# Patient Record
Sex: Male | Born: 2000
Health system: Southern US, Community
[De-identification: ages and names within clinical notes are randomized; demographics above are authoritative.]

## PROBLEM LIST (undated history)

## (undated) DIAGNOSIS — K2970 Gastritis, unspecified, without bleeding: Secondary | ICD-10-CM

## (undated) DIAGNOSIS — K209 Esophagitis, unspecified without bleeding: Secondary | ICD-10-CM

## (undated) DIAGNOSIS — T7840XA Allergy, unspecified, initial encounter: Secondary | ICD-10-CM

## (undated) DIAGNOSIS — K929 Disease of digestive system, unspecified: Secondary | ICD-10-CM

## (undated) DIAGNOSIS — J189 Pneumonia, unspecified organism: Secondary | ICD-10-CM

## (undated) HISTORY — DX: Pneumonia, unspecified organism: J18.9

## (undated) HISTORY — DX: Esophagitis, unspecified: K20.9

## (undated) HISTORY — DX: Allergy, unspecified, initial encounter: T78.40XA

## (undated) HISTORY — DX: Esophagitis, unspecified without bleeding: K20.90

## (undated) HISTORY — DX: Disease of digestive system, unspecified: K92.9

## (undated) HISTORY — PX: ADENOIDECTOMY: SUR15

## (undated) HISTORY — DX: Gastritis, unspecified, without bleeding: K29.70

## (undated) HISTORY — PX: CIRCUMCISION: SUR203

---
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
## 2018-01-31 ENCOUNTER — Encounter (HOSPITAL_COMMUNITY): Payer: Self-pay | Admitting: Psychiatry

## 2018-01-31 ENCOUNTER — Ambulatory Visit (INDEPENDENT_AMBULATORY_CARE_PROVIDER_SITE_OTHER): Payer: No Typology Code available for payment source | Admitting: Psychiatry

## 2018-01-31 ENCOUNTER — Other Ambulatory Visit: Payer: Self-pay

## 2018-01-31 VITALS — BP 118/80 | HR 98 | Ht 72.05 in | Wt 179.0 lb

## 2018-01-31 DIAGNOSIS — F1290 Cannabis use, unspecified, uncomplicated: Secondary | ICD-10-CM

## 2018-01-31 DIAGNOSIS — F332 Major depressive disorder, recurrent severe without psychotic features: Secondary | ICD-10-CM | POA: Diagnosis not present

## 2018-01-31 DIAGNOSIS — F900 Attention-deficit hyperactivity disorder, predominantly inattentive type: Secondary | ICD-10-CM | POA: Diagnosis not present

## 2018-01-31 MED ORDER — ARIPIPRAZOLE 15 MG PO TABS: 15.0000 mg | ORAL_TABLET | Freq: Every day | ORAL | 0 refills | Status: DC

## 2018-01-31 MED ORDER — SERTRALINE HCL 100 MG PO TABS: 100.0000 mg | ORAL_TABLET | Freq: Every day | ORAL | 0 refills | Status: DC
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
## 2018-03-07 ENCOUNTER — Encounter (HOSPITAL_COMMUNITY): Payer: Self-pay | Admitting: Psychiatry

## 2018-03-07 ENCOUNTER — Ambulatory Visit (INDEPENDENT_AMBULATORY_CARE_PROVIDER_SITE_OTHER): Payer: No Typology Code available for payment source | Admitting: Psychiatry

## 2018-03-07 ENCOUNTER — Other Ambulatory Visit: Payer: Self-pay

## 2018-03-07 VITALS — BP 122/80 | HR 97 | Ht 72.5 in | Wt 194.0 lb

## 2018-03-07 DIAGNOSIS — F332 Major depressive disorder, recurrent severe without psychotic features: Secondary | ICD-10-CM | POA: Diagnosis not present

## 2018-03-07 DIAGNOSIS — F900 Attention-deficit hyperactivity disorder, predominantly inattentive type: Secondary | ICD-10-CM | POA: Diagnosis not present

## 2018-03-07 DIAGNOSIS — F1290 Cannabis use, unspecified, uncomplicated: Secondary | ICD-10-CM | POA: Diagnosis not present

## 2018-03-07 MED ORDER — SERTRALINE HCL 100 MG PO TABS: 100.0000 mg | ORAL_TABLET | Freq: Every day | ORAL | 1 refills | Status: DC

## 2018-03-07 MED ORDER — ARIPIPRAZOLE 20 MG PO TABS: 20.0000 mg | ORAL_TABLET | Freq: Every day | ORAL | 1 refills | Status: DC

## 2018-03-07 NOTE — Progress Notes (Signed)
[facility] Outpatient Follow up visit   Patient Identification: Dillon Livermore MRN:  [id number] Date of Evaluation:  03/07/2018 Referral Source: Primary care Chief Complaint:   Chief Complaint    Follow-up; Other     Visit Diagnosis:    ICD-10-CM   1. Major depressive disorder, recurrent severe without psychotic features (HCC) F33.2   2. Attention deficit hyperactivity disorder (ADHD), predominantly inattentive type F90.0   3. Marijuana smoker F12.90     History of Present Illness:  17 years old single white male. Lives with grandparents Suffers from depression admitted in April with suicidal ideations and breakup leading to worsening of depressive and apathy. Also uses marijuana fairly regularly at first visit  Last visit abilify was increased . That helped some but notices there are days when he feels blah till he goes to sleep Still using marijuana but says once a week or less.  He understands the risk of use but states when he is with friends  no tremors  stays at homes . Likes to do drum beats  On adderall for adhd. Despite being at home, says it helps to keep him awake  Modifying F: Grandparents. Drums , music Aggravating F: step dad Duration: more then 5 years      Past Psychiatric History: depression  Previous Psychotropic Medications: Yes   Substance Abuse History in the last 12 months:  Yes.    Consequences of Substance Abuse: Medical Consequences:  depression and impaired judjement  Past Medical History:  Past Medical History:  Diagnosis Date  . Allergy   . Asthma   . Digestive disorder   . Esophagitis   . Gastritis   . Pneumonia     Past Surgical History:  Procedure Laterality Date  . ADENOIDECTOMY    . CIRCUMCISION      Family Psychiatric History: says drug and alcohol use runs in my family Mom also has depression Grandparents ; depression  Family History:  Family History  Problem Relation Age of Onset  . Allergies Mother   . Cancer Maternal  Grandfather   . Allergies Maternal Grandfather   . Heart disease Father   . Cancer Maternal Grandmother   . Cancer Paternal Grandmother     Social History:   Social History   Socioeconomic History  . Marital status: Single    Spouse name: Not on file  . Number of children: Not on file  . Years of education: Not on file  . Highest education level: Not on file  Occupational History  . Not on file  Social Needs  . Financial resource strain: Not on file  . Food insecurity:    Worry: Not on file    Inability: Not on file  . Transportation needs:    Medical: Not on file    Non-medical: Not on file  Tobacco Use  . Smoking status: Never Smoker  . Smokeless tobacco: Never Used  Substance and Sexual Activity  . Alcohol use: No  . Drug use: Yes    Frequency: 3.0 times per week    Types: Marijuana    Comment: a few times a week  . Sexual activity: Yes    Birth control/protection: None    Comment: Identifies as Bisexual  Lifestyle  . Physical activity:    Days per week: Not on file    Minutes per session: Not on file  . Stress: Not on file  Relationships  . Social connections:    Talks on phone: Not on file  Gets together: Not on file    Attends religious service: Not on file    Active member of club or organization: Not on file    Attends meetings of clubs or organizations: Not on file    Relationship status: Not on file  Other Topics Concern  . Not on file  Social History Narrative  . Not on file      Allergies:   Allergies  Allergen Reactions  . Lactase Other (See Comments)    Upset stomach and bowel movements     Metabolic Disorder Labs: Lab Results  Component Value Date   HGBA1C 4.9 11/28/2017   MPG 93.93 11/28/2017   Lab Results  Component Value Date   PROLACTIN 5.2 11/28/2017   Lab Results  Component Value Date   CHOL 112 11/28/2017   TRIG 58 11/28/2017   HDL 46 11/28/2017   CHOLHDL 2.4 11/28/2017   VLDL 12 11/28/2017   LDLCALC 54  11/28/2017     Current Medications: Current Outpatient Medications  Medication Sig Dispense Refill  . albuterol (PROVENTIL HFA;VENTOLIN HFA) 108 (90 BASE) MCG/ACT inhaler Inhale 2 puffs into the lungs every 6 (six) hours as needed. For shortness of breath or wheezing     . amphetamine-dextroamphetamine (ADDERALL XR) 30 MG 24 hr capsule Take 1 capsule (30 mg total) by mouth every morning. 30 capsule 0  . ARIPiprazole (ABILIFY) 20 MG tablet Take 1 tablet (20 mg total) by mouth at bedtime. 30 tablet 1  . Fluticasone-Salmeterol (ADVAIR HFA IN) Inhale 2 puffs into the lungs every 12 (twelve) hours as needed (SOB).     Marland Kitchen sertraline (ZOLOFT) 100 MG tablet Take 1 tablet (100 mg total) by mouth at bedtime. 30 tablet 1   No current facility-administered medications for this visit.       Psychiatric Specialty Exam: Review of Systems  Cardiovascular: Negative for palpitations.  Neurological: Negative for tremors.  Psychiatric/Behavioral: Positive for depression.    Blood pressure 122/80, pulse 97, height 6' 0.5" (1.842 m), weight 194 lb (88 kg).Body mass index is 25.95 kg/m.  General Appearance: Casual  Eye Contact:  Fair  Speech:  Slow  Volume:  Normal  Mood:  subdued  Affect:  Constricted  Thought Process:  Goal Directed  Orientation:  Full (Time, Place, and Person)  Thought Content:  Rumination  Suicidal Thoughts:  No  Homicidal Thoughts:  No  Memory:  Immediate;   Fair Recent;   Fair  Judgement:  Fair  Insight:  Shallow  Psychomotor Activity:  Normal  Concentration:  Concentration: Fair and Attention Span: Fair  Recall:  [employer] of Knowledge:Fair  Language: Fair  Akathisia:  Negative  Handed:  Right  AIMS (if indicated):  0  Assets:  Desire for Improvement  ADL's:  Intact  Cognition: WNL  Sleep:  fair    Treatment Plan Summary: Medication management and Plan as follows  1. Mood disorder, possible as MDD recurrent moderate: endorses sad days. Increase abilifyt to  20mg . Abstain from marijuana   2. ADHD diagnosed by primary care on adderall or stimulants for last 3 years. Says feels sleepy without it, caution as it can make anxiety worse 3. Marijuana smoker: says not using regularly. encoureged to stop so meds would work and discussed its effect on mood, paranoia and judjement  Fu 4 w but call back in 2 weeks. Not suicidal.   Doing therapy says will continue with prior therapist. Working on coping skills and depression     Elaysia Devargas  Gilmore LarocheAkhtar, MD 7/16/20193:44 PM

## 2018-03-08 ENCOUNTER — Other Ambulatory Visit (HOSPITAL_COMMUNITY): Payer: Self-pay | Admitting: Psychiatry

## 2018-03-22 ENCOUNTER — Telehealth (HOSPITAL_COMMUNITY): Payer: Self-pay | Admitting: Psychiatry

## 2018-03-22 NOTE — Telephone Encounter (Signed)
Pt is doing fine on meds. His only concern is if he don't take the adderall in the morning then he becomes very sleepy in the afternoon. He would end up taking about a 4 hour nap.  He would like to know If there is something we can do for his sleepiness.   Please advise

## 2018-03-22 NOTE — Telephone Encounter (Signed)
He has been getting adderall from primary care, he can adjust that time  . In regard to abilify if its making him sleepy should take at evening.

## 2018-03-22 NOTE — Telephone Encounter (Signed)
Patient states that he is already taking the Zoloft and Abilify at night and if he doesn't take his Adderall during the day he will not get anything done. He wants to know what he can do about being so sleepy during the day.

## 2018-03-23 NOTE — Telephone Encounter (Signed)
Informed patient per Dr. Gilmore LarocheAkhtar to take Abilify in the day and try to stay substance free. Patient stated his understanding.

## 2018-03-23 NOTE — Telephone Encounter (Signed)
Change abilify for day. I am not taking over adderall prescription if has been given by primary care. He can call primary care for refills.  Also to remain substance free

## 2018-04-18 ENCOUNTER — Ambulatory Visit (HOSPITAL_COMMUNITY): Payer: No Typology Code available for payment source | Admitting: Psychiatry

## 2018-05-01 ENCOUNTER — Other Ambulatory Visit: Payer: Self-pay

## 2018-05-01 ENCOUNTER — Ambulatory Visit (INDEPENDENT_AMBULATORY_CARE_PROVIDER_SITE_OTHER): Payer: No Typology Code available for payment source | Admitting: Psychiatry

## 2018-05-01 ENCOUNTER — Encounter (HOSPITAL_COMMUNITY): Payer: Self-pay | Admitting: Psychiatry

## 2018-05-01 VITALS — BP 98/68 | HR 82 | Ht 72.5 in | Wt 203.0 lb

## 2018-05-01 DIAGNOSIS — F332 Major depressive disorder, recurrent severe without psychotic features: Secondary | ICD-10-CM | POA: Diagnosis not present

## 2018-05-01 DIAGNOSIS — F1290 Cannabis use, unspecified, uncomplicated: Secondary | ICD-10-CM

## 2018-05-01 DIAGNOSIS — F900 Attention-deficit hyperactivity disorder, predominantly inattentive type: Secondary | ICD-10-CM

## 2018-05-01 DIAGNOSIS — Z818 Family history of other mental and behavioral disorders: Secondary | ICD-10-CM | POA: Diagnosis not present

## 2018-05-01 MED ORDER — SERTRALINE HCL 100 MG PO TABS: 100.0000 mg | ORAL_TABLET | Freq: Every day | ORAL | 1 refills | Status: DC

## 2018-05-01 MED ORDER — ARIPIPRAZOLE 20 MG PO TABS: 20.0000 mg | ORAL_TABLET | Freq: Every day | ORAL | 1 refills | Status: DC

## 2018-05-01 NOTE — Progress Notes (Signed)
[facility] Outpatient Follow up visit   Patient Identification: Paul Warren MRN:  [id number] Date of Evaluation:  05/01/2018 Referral Source: Primary care Chief Complaint:   Chief Complaint    Follow-up; Other     Visit Diagnosis:    ICD-10-CM   1. Major depressive disorder, recurrent severe without psychotic features (HCC) F33.2   2. Attention deficit hyperactivity disorder (ADHD), predominantly inattentive type F90.0   3. Marijuana smoker F12.90     History of Present Illness:  17 years old single white male. Lives with grandparents Suffers from depression admitted in April with suicidal ideations and breakup leading to worsening of depressive and apathy. Also uses marijuana fairly regularly at first visit Prior admission for suicidal toughts after breakup   abilify on now 20mg . Not depressed on a daily basis. Joined college. Handling it fair.  No tremors  on adderall for adhd. Doesn't take everyday, primary care gives him   Likes to do drum beats   Modifying F: Grandparents. Drums , music Aggravating F: step dad Duration: more then 5 years      Past Psychiatric History: depression  Previous Psychotropic Medications: Yes   Substance Abuse History in the last 12 months:  Yes.    Consequences of Substance Abuse: Medical Consequences:  depression and impaired judjement  Past Medical History:  Past Medical History:  Diagnosis Date  . Allergy   . Asthma   . Digestive disorder   . Esophagitis   . Gastritis   . Pneumonia     Past Surgical History:  Procedure Laterality Date  . ADENOIDECTOMY    . CIRCUMCISION      Family Psychiatric History: says drug and alcohol use runs in my family Mom also has depression Grandparents ; depression  Family History:  Family History  Problem Relation Age of Onset  . Allergies Mother   . Cancer Maternal Grandfather   . Allergies Maternal Grandfather   . Heart disease Father   . Cancer Maternal Grandmother   . Cancer  Paternal Grandmother     Social History:   Social History   Socioeconomic History  . Marital status: Single    Spouse name: Not on file  . Number of children: Not on file  . Years of education: Not on file  . Highest education level: Not on file  Occupational History  . Not on file  Social Needs  . Financial resource strain: Not on file  . Food insecurity:    Worry: Not on file    Inability: Not on file  . Transportation needs:    Medical: Not on file    Non-medical: Not on file  Tobacco Use  . Smoking status: Never Smoker  . Smokeless tobacco: Never Used  Substance and Sexual Activity  . Alcohol use: No  . Drug use: Yes    Frequency: 3.0 times per week    Types: Marijuana    Comment: a few times a week  . Sexual activity: Yes    Birth control/protection: None    Comment: Identifies as Bisexual  Lifestyle  . Physical activity:    Days per week: Not on file    Minutes per session: Not on file  . Stress: Not on file  Relationships  . Social connections:    Talks on phone: Not on file    Gets together: Not on file    Attends religious service: Not on file    Active member of club or organization: Not on file  Attends meetings of clubs or organizations: Not on file    Relationship status: Not on file  Other Topics Concern  . Not on file  Social History Narrative  . Not on file      Allergies:   Allergies  Allergen Reactions  . Lactase Other (See Comments)    Upset stomach and bowel movements     Metabolic Disorder Labs: Lab Results  Component Value Date   HGBA1C 4.9 11/28/2017   MPG 93.93 11/28/2017   Lab Results  Component Value Date   PROLACTIN 5.2 11/28/2017   Lab Results  Component Value Date   CHOL 112 11/28/2017   TRIG 58 11/28/2017   HDL 46 11/28/2017   CHOLHDL 2.4 11/28/2017   VLDL 12 11/28/2017   LDLCALC 54 11/28/2017     Current Medications: Current Outpatient Medications  Medication Sig Dispense Refill  . albuterol  (PROVENTIL HFA;VENTOLIN HFA) 108 (90 BASE) MCG/ACT inhaler Inhale 2 puffs into the lungs every 6 (six) hours as needed. For shortness of breath or wheezing     . amphetamine-dextroamphetamine (ADDERALL XR) 30 MG 24 hr capsule Take 1 capsule (30 mg total) by mouth every morning. 30 capsule 0  . ARIPiprazole (ABILIFY) 20 MG tablet Take 1 tablet (20 mg total) by mouth at bedtime. 30 tablet 1  . Fluticasone-Salmeterol (ADVAIR HFA IN) Inhale 2 puffs into the lungs every 12 (twelve) hours as needed (SOB).     Marland Kitchen sertraline (ZOLOFT) 100 MG tablet Take 1 tablet (100 mg total) by mouth at bedtime. 30 tablet 1   No current facility-administered medications for this visit.       Psychiatric Specialty Exam: Review of Systems  Cardiovascular: Negative for chest pain.  Neurological: Negative for tremors.    Blood pressure 98/68, pulse 82, height 6' 0.5" (1.842 m), weight 203 lb (92.1 kg).Body mass index is 27.15 kg/m.  General Appearance: Casual  Eye Contact:  Fair  Speech:  Slow  Volume:  Normal  Mood:  fair  Affect:  Constricted  Thought Process:  Goal Directed  Orientation:  Full (Time, Place, and Person)  Thought Content:  Rumination  Suicidal Thoughts:  No  Homicidal Thoughts:  No  Memory:  Immediate;   Fair Recent;   Fair  Judgement:  Fair  Insight:  Shallow  Psychomotor Activity:  Normal  Concentration:  Concentration: Fair and Attention Span: Fair  Recall:  [employer] of Knowledge:Fair  Language: Fair  Akathisia:  Negative  Handed:  Right  AIMS (if indicated):  0  Assets:  Desire for Improvement  ADL's:  Intact  Cognition: WNL  Sleep:  fair    Treatment Plan Summary: Medication management and Plan as follows  1. Mood disorder, possible as MDD recurrent moderate: doing fair. Continue abilify  Says when he can start getting off meds. Probably spring next year we can talk about cutting some down   2. ADHD diagnosed by primary care on adderall or stimulants for last 3  years.sleepy without it, gets from primary care 3. Marijuana smoker: hasnt used for last one month. Relapse prevention discussed Fu 2-3 m     Thresa Ross, MD 9/9/20193:09 PM

## 2018-07-04 ENCOUNTER — Other Ambulatory Visit (HOSPITAL_COMMUNITY): Payer: Self-pay | Admitting: Psychiatry

## 2018-07-14 ENCOUNTER — Encounter (HOSPITAL_COMMUNITY): Payer: Self-pay | Admitting: Psychiatry

## 2018-07-14 ENCOUNTER — Ambulatory Visit (INDEPENDENT_AMBULATORY_CARE_PROVIDER_SITE_OTHER): Payer: Medicaid Other | Admitting: Psychiatry

## 2018-07-14 DIAGNOSIS — F1290 Cannabis use, unspecified, uncomplicated: Secondary | ICD-10-CM | POA: Diagnosis not present

## 2018-07-14 DIAGNOSIS — F332 Major depressive disorder, recurrent severe without psychotic features: Secondary | ICD-10-CM | POA: Diagnosis not present

## 2018-07-14 DIAGNOSIS — F900 Attention-deficit hyperactivity disorder, predominantly inattentive type: Secondary | ICD-10-CM | POA: Diagnosis not present

## 2018-07-14 MED ORDER — ARIPIPRAZOLE 20 MG PO TABS: ORAL_TABLET | ORAL | 1 refills | Status: AC

## 2018-07-14 MED ORDER — SERTRALINE HCL 100 MG PO TABS: ORAL_TABLET | ORAL | 1 refills | Status: AC

## 2018-07-14 NOTE — Progress Notes (Signed)
[facility] Outpatient Follow up visit   Patient Identification: Paul Warren MRN:  [id number] Date of Evaluation:  07/14/2018 Referral Source: Primary care Chief Complaint:    Visit Diagnosis:    ICD-10-CM   1. Major depressive disorder, recurrent severe without psychotic features (HCC) F33.2   2. Attention deficit hyperactivity disorder (ADHD), predominantly inattentive type F90.0   3. Marijuana smoker F12.90     History of Present Illness:  17 years old single white male. Lives with grandparents Suffers from depression admitted in April  Prior admission for suicidal toughts after breakup  Doing fair, adjusting tlo [employer] depression not worse. Grades not good so planning to retake some courses next year  Says some concern of pornography or sex addiction. Working in therapy but may be referred to a specialist by that therapist. Says it has been there even before medication use or depression.  on adderall for adhd. Doesn't take everyday, primary care gives him   Likes to do drum beats   Modifying F: grandparents Drums , music Aggravating F: step dad Duration: more then 5 years      Past Psychiatric History: depression  Previous Psychotropic Medications: Yes   Substance Abuse History in the last 12 months:  Yes.    Consequences of Substance Abuse: Medical Consequences:  depression and impaired judjement  Past Medical History:  Past Medical History:  Diagnosis Date  . Allergy   . Asthma   . Digestive disorder   . Esophagitis   . Gastritis   . Pneumonia     Past Surgical History:  Procedure Laterality Date  . ADENOIDECTOMY    . CIRCUMCISION      Family Psychiatric History: says drug and alcohol use runs in my family Mom also has depression Grandparents ; depression  Family History:  Family History  Problem Relation Age of Onset  . Allergies Mother   . Cancer Maternal Grandfather   . Allergies Maternal Grandfather   . Heart disease Father   . Cancer Maternal  Grandmother   . Cancer Paternal Grandmother     Social History:   Social History   Socioeconomic History  . Marital status: Single    Spouse name: Not on file  . Number of children: Not on file  . Years of education: Not on file  . Highest education level: Not on file  Occupational History  . Not on file  Social Needs  . Financial resource strain: Not on file  . Food insecurity:    Worry: Not on file    Inability: Not on file  . Transportation needs:    Medical: Not on file    Non-medical: Not on file  Tobacco Use  . Smoking status: Never Smoker  . Smokeless tobacco: Never Used  Substance and Sexual Activity  . Alcohol use: No  . Drug use: Yes    Frequency: 3.0 times per week    Types: Marijuana    Comment: a few times a week  . Sexual activity: Yes    Birth control/protection: None    Comment: Identifies as Bisexual  Lifestyle  . Physical activity:    Days per week: Not on file    Minutes per session: Not on file  . Stress: Not on file  Relationships  . Social connections:    Talks on phone: Not on file    Gets together: Not on file    Attends religious service: Not on file    Active member of club or organization: Not on  file    Attends meetings of clubs or organizations: Not on file    Relationship status: Not on file  Other Topics Concern  . Not on file  Social History Narrative  . Not on file      Allergies:   Allergies  Allergen Reactions  . Lactase Other (See Comments)    Upset stomach and bowel movements     Metabolic Disorder Labs: Lab Results  Component Value Date   HGBA1C 4.9 11/28/2017   MPG 93.93 11/28/2017   Lab Results  Component Value Date   PROLACTIN 5.2 11/28/2017   Lab Results  Component Value Date   CHOL 112 11/28/2017   TRIG 58 11/28/2017   HDL 46 11/28/2017   CHOLHDL 2.4 11/28/2017   VLDL 12 11/28/2017   LDLCALC 54 11/28/2017     Current Medications: Current Outpatient Medications  Medication Sig Dispense  Refill  . albuterol (PROVENTIL HFA;VENTOLIN HFA) 108 (90 BASE) MCG/ACT inhaler Inhale 2 puffs into the lungs every 6 (six) hours as needed. For shortness of breath or wheezing     . amphetamine-dextroamphetamine (ADDERALL XR) 30 MG 24 hr capsule Take 1 capsule (30 mg total) by mouth every morning. 30 capsule 0  . ARIPiprazole (ABILIFY) 20 MG tablet TAKE 1 TABLET BY MOUTH EVERYDAY AT BEDTIME 30 tablet 1  . Fluticasone-Salmeterol (ADVAIR HFA IN) Inhale 2 puffs into the lungs every 12 (twelve) hours as needed (SOB).     Marland Kitchen. sertraline (ZOLOFT) 100 MG tablet TAKE 1 TABLET BY MOUTH EVERYDAY AT BEDTIME 30 tablet 1   No current facility-administered medications for this visit.       Psychiatric Specialty Exam: Review of Systems  Cardiovascular: Negative for chest pain.  Neurological: Negative for tremors.  Psychiatric/Behavioral: Negative for depression.    There were no vitals taken for this visit.There is no height or weight on file to calculate BMI.  General Appearance: Casual  Eye Contact:  Fair  Speech:  Slow  Volume:  Normal  Mood: fair  Affect:  Constricted  Thought Process:  Goal Directed  Orientation:  Full (Time, Place, and Person)  Thought Content:  Rumination  Suicidal Thoughts:  No  Homicidal Thoughts:  No  Memory:  Immediate;   Fair Recent;   Fair  Judgement:  Fair  Insight:  Shallow  Psychomotor Activity:  Normal  Concentration:  Concentration: Fair and Attention Span: Fair  Recall:  [employer]  Fund of Knowledge:Fair  Language: Fair  Akathisia:  Negative  Handed:  Right  AIMS (if indicated):  0  Assets:  Desire for Improvement  ADL's:  Intact  Cognition: WNL  Sleep:  fair    Treatment Plan Summary: Medication management and Plan as follows  1. Mood disorder, possible as MDD recurrent moderate: doing fair. Continue zoloft, abilify Working in therapy and may get refferal by therapist to a further specialist in addiction 2. ADHD diagnosed by primary care on  adderall 3. Marijuana smoker:says using it infrequently. Discussed to abstain and relapse prevnetion Fu 2-3 m questions addressed     Thresa RossNadeem Charese Abundis, MD 11/22/20191:29 PM

## 2018-09-22 ENCOUNTER — Ambulatory Visit (HOSPITAL_COMMUNITY): Payer: Medicaid Other | Admitting: Psychiatry

## 2018-11-10 ENCOUNTER — Telehealth: Payer: Self-pay | Admitting: General Practice

## 2018-11-10 NOTE — Telephone Encounter (Signed)
Incoming call from Patient who reports  That he woke up this am with chest Pain around his breast bone area like a stabbing sensation.   Patient states the pain is constant.  Patient also states that he does not have a a Primary Care Provider.   Patient was calling out of concern of the Covid-19  Issues also.  Screened for Risk of Coovid -19.  Patient is low risk  Recommend that Patient go to Urgent care for evaluation of chest Pain.  Patient voiced understanding.

## 2018-11-14 DIAGNOSIS — J029 Acute pharyngitis, unspecified: Secondary | ICD-10-CM | POA: Diagnosis not present

## 2018-11-14 DIAGNOSIS — R110 Nausea: Secondary | ICD-10-CM | POA: Diagnosis not present

## 2018-11-14 DIAGNOSIS — K219 Gastro-esophageal reflux disease without esophagitis: Secondary | ICD-10-CM | POA: Diagnosis not present

## 2018-12-05 ENCOUNTER — Other Ambulatory Visit: Payer: Self-pay

## 2018-12-05 ENCOUNTER — Encounter (HOSPITAL_COMMUNITY): Payer: Self-pay | Admitting: *Deleted

## 2018-12-05 ENCOUNTER — Emergency Department (HOSPITAL_COMMUNITY)
Admission: EM | Admit: 2018-12-05 | Discharge: 2018-12-05 | Disposition: A | Discharge status: Home / Self Care | Payer: BLUE CROSS/BLUE SHIELD | Attending: Emergency Medicine | Admitting: Emergency Medicine

## 2018-12-05 DIAGNOSIS — F1994 Other psychoactive substance use, unspecified with psychoactive substance-induced mood disorder: Secondary | ICD-10-CM | POA: Diagnosis not present

## 2018-12-05 DIAGNOSIS — J45909 Unspecified asthma, uncomplicated: Secondary | ICD-10-CM | POA: Insufficient documentation

## 2018-12-05 DIAGNOSIS — F332 Major depressive disorder, recurrent severe without psychotic features: Secondary | ICD-10-CM

## 2018-12-05 DIAGNOSIS — F314 Bipolar disorder, current episode depressed, severe, without psychotic features: Secondary | ICD-10-CM | POA: Diagnosis not present

## 2018-12-05 DIAGNOSIS — R1110 Vomiting, unspecified: Secondary | ICD-10-CM | POA: Diagnosis present

## 2018-12-05 DIAGNOSIS — F1210 Cannabis abuse, uncomplicated: Secondary | ICD-10-CM | POA: Diagnosis not present

## 2018-12-05 DIAGNOSIS — Z79899 Other long term (current) drug therapy: Secondary | ICD-10-CM | POA: Diagnosis not present

## 2018-12-05 DIAGNOSIS — R000 Tachycardia, unspecified: Secondary | ICD-10-CM | POA: Diagnosis not present

## 2018-12-05 DIAGNOSIS — F450 Somatization disorder: Secondary | ICD-10-CM

## 2018-12-05 DIAGNOSIS — F4321 Adjustment disorder with depressed mood: Secondary | ICD-10-CM | POA: Diagnosis not present

## 2018-12-05 DIAGNOSIS — E876 Hypokalemia: Secondary | ICD-10-CM | POA: Diagnosis not present

## 2018-12-05 LAB — COMPREHENSIVE METABOLIC PANEL
ALT: 24 U/L (ref 0–44)
AST: 22 U/L (ref 15–41)
Albumin: 4.5 g/dL (ref 3.5–5.0)
Alkaline Phosphatase: 60 U/L (ref 38–126)
Anion gap: 13 (ref 5–15)
BUN: 10 mg/dL (ref 6–20)
CO2: 23 mmol/L (ref 22–32)
Calcium: 8.7 mg/dL — ABNORMAL LOW (ref 8.9–10.3)
Chloride: 102 mmol/L (ref 98–111)
Creatinine, Ser: 0.94 mg/dL (ref 0.61–1.24)
GFR calc Af Amer: >60 mL/min (ref >60)
GFR calc non Af Amer: >60 mL/min (ref >60)
Glucose, Bld: 155 mg/dL — ABNORMAL HIGH (ref 70–99)
Potassium: 2.9 mmol/L — ABNORMAL LOW (ref 3.5–5.1)
Sodium: 138 mmol/L (ref 135–145)
Total Bilirubin: 0.6 mg/dL (ref 0.3–1.2)
Total Protein: 7.6 g/dL (ref 6.5–8.1)

## 2018-12-05 LAB — CBC WITH DIFFERENTIAL/PLATELET
Abs Immature Granulocytes: 0.12 10*3/uL — ABNORMAL HIGH (ref 0.00–0.07)
Basophils Absolute: 0.1 10*3/uL (ref 0.0–0.1)
Basophils Relative: 1 %
Eosinophils Absolute: 0.2 10*3/uL (ref 0.0–0.5)
Eosinophils Relative: 1 %
HCT: 47.2 % (ref 39.0–52.0)
Hemoglobin: 15.7 g/dL (ref 13.0–17.0)
Immature Granulocytes: 1 %
Lymphocytes Relative: 25 %
Lymphs Abs: 4.5 10*3/uL — ABNORMAL HIGH (ref 0.7–4.0)
MCH: 30 pg (ref 26.0–34.0)
MCHC: 33.3 g/dL (ref 30.0–36.0)
MCV: 90.1 fL (ref 80.0–100.0)
Monocytes Absolute: 1 10*3/uL (ref 0.1–1.0)
Monocytes Relative: 6 %
Neutro Abs: 12.2 10*3/uL — ABNORMAL HIGH (ref 1.7–7.7)
Neutrophils Relative %: 66 %
Platelets: 339 10*3/uL (ref 150–400)
RBC: 5.24 MIL/uL (ref 4.22–5.81)
RDW: 12.2 % (ref 11.5–15.5)
WBC: 18.1 10*3/uL — ABNORMAL HIGH (ref 4.0–10.5)
nRBC: 0 % (ref 0.0–0.2)

## 2018-12-05 LAB — ETHANOL: Alcohol, Ethyl (B): <10 mg/dL (ref <10)

## 2018-12-05 MED ORDER — ONDANSETRON HCL 4 MG PO TABS: 4.0000 mg | ORAL_TABLET | Freq: Three times a day (TID) | ORAL | Status: DC | PRN

## 2018-12-05 MED ORDER — ALUM & MAG HYDROXIDE-SIMETH 200-200-20 MG/5ML PO SUSP: 30.0000 mL | Freq: Four times a day (QID) | ORAL | Status: DC | PRN

## 2018-12-05 MED ORDER — POTASSIUM CHLORIDE CRYS ER 20 MEQ PO TBCR
40.0000 meq | EXTENDED_RELEASE_TABLET | Freq: Once | ORAL | Status: AC
  Administered 2018-12-05: 40 meq via ORAL
  Filled 2018-12-05: qty 2

## 2018-12-05 MED ORDER — SODIUM CHLORIDE 0.9 % IV BOLUS
1000.0000 mL | Freq: Once | INTRAVENOUS | Status: AC
  Administered 2018-12-05: 1000 mL via INTRAVENOUS

## 2018-12-05 MED ORDER — ONDANSETRON 8 MG PO TBDP
8.0000 mg | ORAL_TABLET | Freq: Once | ORAL | Status: AC
  Administered 2018-12-05: 8 mg via ORAL
  Filled 2018-12-05: qty 1

## 2018-12-05 MED ORDER — ACETAMINOPHEN 325 MG PO TABS: 650.0000 mg | ORAL_TABLET | ORAL | Status: DC | PRN

## 2018-12-05 MED ORDER — POTASSIUM CHLORIDE 10 MEQ/100ML IV SOLN
10.0000 meq | Freq: Once | INTRAVENOUS | Status: AC
  Administered 2018-12-05: 10 meq via INTRAVENOUS
  Filled 2018-12-05: qty 100

## 2018-12-05 NOTE — Discharge Instructions (Signed)
For your behavioral health needs, you are advised to continue treatment with your current outpatient providers. °

## 2018-12-05 NOTE — ED Notes (Signed)
Pt still unable to provide urine sample 

## 2018-12-05 NOTE — BHH Counselor (Signed)
  BH Assessment Disposition:   [facility] discussed case with BH provider, Malachy Chamber, NP who psychiatrically clears patient and recommends patient receives behavior health outpatient resources to re-establish medication managment.    Paul Warren L. Jeree Delcid, MS, [facility], [facility] Therapeutic Triage Specialist  [phone]

## 2018-12-05 NOTE — BH Assessment (Signed)
[facility] Assessment Progress Note  Per Juanetta Beets, DO, this pt does not require psychiatric hospitalization at this time.  Pt is to be discharged from [facility] with recommendation to continue treatment with his unspecified current outpatient providers.  This has been included in pt's discharge instructions.  This Clinical research associate attempted to reach pt's nurse, but pt has reportedly already been discharged.  Doylene Canning, MA Triage Specialist [phone]

## 2018-12-05 NOTE — ED Notes (Signed)
Will collect urine when pt. Voids. Pt. Aware of needing urine specimen. Nurse aware.

## 2018-12-05 NOTE — ED Provider Notes (Signed)
[facility]-EMERGENCY DEPT Provider Note   CSN: [id number] Arrival date & time: 12/05/18  0443    History   Chief Complaint Chief Complaint  Patient presents with  . erratic behavior    HPI Paul Warren is a [age] y.o. male.  The history is provided by the patient.  He has history of asthma and depression and comes in complaining of a large collection of mucus that moves through his stomach and intermittently causes him to throw up.  He describes vomiting slimy mucus.  He is extremely descriptive of all of these events.  This is been going on for the past month.  He does admit to smoking some marijuana tonight.  He denies alcohol or other drug use.  He denies hallucinations.  He denies homicidal or suicidal ideation.  Past Medical History:  Diagnosis Date  . Allergy   . Asthma   . Digestive disorder   . Esophagitis   . Gastritis   . Pneumonia     Patient Active Problem List   Diagnosis Date Noted  . Major depressive disorder, recurrent severe without psychotic features (HCC) 11/27/2017    Past Surgical History:  Procedure Laterality Date  . ADENOIDECTOMY    . CIRCUMCISION          Home Medications    Prior to Admission medications   Medication Sig Start Date End Date Taking? Authorizing Provider  albuterol (PROVENTIL HFA;VENTOLIN HFA) 108 (90 BASE) MCG/ACT inhaler Inhale 2 puffs into the lungs every 6 (six) hours as needed. For shortness of breath or wheezing     [provider]  amphetamine-dextroamphetamine (ADDERALL XR) 30 MG 24 hr capsule Take 1 capsule (30 mg total) by mouth every morning. 12/03/17   Denzil Magnusonhomas, Lashunda, NP  ARIPiprazole (ABILIFY) 20 MG tablet TAKE 1 TABLET BY MOUTH EVERYDAY AT BEDTIME 07/14/18   Thresa RossAkhtar, Nadeem, MD  Fluticasone-Salmeterol (ADVAIR HFA IN) Inhale 2 puffs into the lungs every 12 (twelve) hours as needed (SOB).     [provider]  sertraline (ZOLOFT) 100 MG tablet TAKE 1 TABLET BY MOUTH EVERYDAY AT  BEDTIME 07/14/18   Thresa RossAkhtar, Nadeem, MD    Family History Family History  Problem Relation Age of Onset  . Allergies Mother   . Cancer Maternal Grandfather   . Allergies Maternal Grandfather   . Heart disease Father   . Cancer Maternal Grandmother   . Cancer Paternal Grandmother     Social History Social History   Tobacco Use  . Smoking status: Never Smoker  . Smokeless tobacco: Never Used  Substance Use Topics  . Alcohol use: No  . Drug use: Yes    Frequency: 3.0 times per week    Types: Marijuana    Comment: a few times a week     Allergies   Lactase   Review of Systems Review of Systems  All other systems reviewed and are negative.    Physical Exam Updated Vital Signs BP (!) 140/93 (BP Location: Left Arm)   Pulse (!) 140 Comment: pt vomiting  Temp 98.7 F (37.1 C) (Oral)   Resp 20   Ht 6\' 1"  (1.854 m)   Wt 95.3 kg   SpO2 98%   BMI 27.71 kg/m   Physical Exam Vitals signs and nursing note reviewed.    18 year old male, resting comfortably and in no acute distress. Vital signs are significant for mildly elevated blood pressure and rapid heart rate. Oxygen saturation is 98%, which is normal. Head is  normocephalic and atraumatic. PERRLA, EOMI. Oropharynx is clear. Neck is nontender and supple without adenopathy or JVD. Back is nontender and there is no CVA tenderness. Lungs are clear without rales, wheezes, or rhonchi. Chest is nontender. Heart has regular rate and rhythm without murmur. Abdomen is soft, flat, nontender without masses or hepatosplenomegaly and peristalsis is normoactive. Extremities have no cyanosis or edema, full range of motion is present. Skin is warm and dry without rash. Neurologic: Awake and alert, oriented x3, normal speech, cranial nerves are intact, there are no motor or sensory deficits. Psychiatric: Appears anxious and is intermittently agitated.  ED Treatments / Results  Labs (all labs ordered are listed, but only  abnormal results are displayed) Labs Reviewed  CBC WITH DIFFERENTIAL/PLATELET - Abnormal; Notable for the following components:      Result Value   WBC 18.1 (*)    Neutro Abs 12.2 (*)    Lymphs Abs 4.5 (*)    Abs Immature Granulocytes 0.12 (*)    All other components within normal limits  COMPREHENSIVE METABOLIC PANEL - Abnormal; Notable for the following components:   Potassium 2.9 (*)    Glucose, Bld 155 (*)    Calcium 8.7 (*)    All other components within normal limits  ETHANOL    Procedures Procedures   Medications Ordered in ED Medications  ondansetron (ZOFRAN-ODT) disintegrating tablet 8 mg (8 mg Oral Given 12/05/18 0521)  sodium chloride 0.9 % bolus 1,000 mL (0 mLs Intravenous Stopped 12/05/18 0559)  potassium chloride 10 mEq in 100 mL IVPB (0 mEq Intravenous Stopped 12/05/18 0703)  potassium chloride SA (K-DUR,KLOR-CON) CR tablet 40 mEq (40 mEq Oral Given 12/05/18 0601)     Initial Impression / Assessment and Plan / ED Course  I have reviewed the triage vital signs and the nursing notes.  Pertinent lab results that were available during my care of the patient were reviewed by me and considered in my medical decision making (see chart for details).  Complains of mucus in the abdomen but this seems to be more of a psychiatric issue.  Exam is benign other than his rather bizarre affect and extremely descriptive and emotive telling of his story.  Old records are reviewed, and he had [facility] a [facility].  Will check screening labs and will probably need TTS consultation.  Final Clinical Impressions(s) / ED Diagnoses   Final diagnoses:  Somatization disorder  Hypokalemia  Major depressive disorder, recurrent severe without psychotic features (HCC)  Substance induced mood disorder [facility])    ED Discharge Orders    None       Dione Booze, MD 12/05/18 2242

## 2018-12-05 NOTE — ED Notes (Addendum)
Pt keeps stating over and over "its coming. It is coming. There is a big ball, big big ball coming"

## 2018-12-05 NOTE — ED Notes (Addendum)
Pt unable to provide urine sample right now. Will notify staff when able

## 2018-12-05 NOTE — ED Triage Notes (Signed)
Pt yelling in Triage stating "I have a lot of mucus in my belly.  It's like a big ball, it went through my stomach once and it's happening again.  I don't know what the f-- it's doing to me.  I'm lazy.  My allergy are bad.  I smoked a joint with friends a couple of hours ago."

## 2018-12-05 NOTE — ED Notes (Addendum)
Pt yelling from room, "it's happening!! There is a big ball in my stomach and I feel it coming". Primary RN Sarah at bedside.

## 2018-12-05 NOTE — BH Assessment (Signed)
Tele Assessment Note   Patient Name: Paul CenterJoseph Port MRN: [id number] Referring Physician: Dr. Dione Boozeavid Glick, MD Location of Patient: [facility] Location of Provider: Behavioral Health TTS Department  Paul Warren is an 18 y.o. male who [facility] to not feeling well and was recommended for psychiatric evaluation by ER provider.  Pt states "I was at a friend's house and I felt like I wanted to throw up a ball of mucus. It kind of came up or moved but it didn't come out."  Pt admits "smoking 2 blunts PTA" and states "I usually smoke 1-2 times per week".  Pt admits to drinking alcohol socially. Pt states "last time I drank was Valentines Day, which was 2 months ago. "  Pt denies any other substance use.  Pt admits [facility]; which was followed up with outpatient treatment with Cleon DewBarbara Vaughan as his therapist.  Pt report not remembering his provider for medication management.  Pt denies SI/HI/A/V-hallucinations.   Pt reside with his mother and 2 sisters.  Pt reports reports he can return at discharge.  Pt works at [employer] but states although he is getting hours he still being impacted by the current social conditions.  Pt reports dropping out of school in mid January 2020.  Pt denies having a history of physical, sexual and verbal abuse.    Patient was wearing scrubs and appeared appropriately groomed.  Pt was alert throughout the assessment.  Patient made good eye contact and had  normal psychomotor activity.  Patient spoke in a normal voice without pressured speech.  Pt expressed feeling normal.  Pt's affect appeared Euthymic and congruent with stated mood. Pt's thought process was coherent and logical.  Pt presented with good insight and judgement.  Pt did not appear to be responding to internal stimuli.  Pt was able to contract for safety.  Disposition: [facility] discussed case with BH provider, Malachy Chamberakia Starkes, NP who  psychiatrically clears patient and recommends patient receives behavior health outpatient resources to reestablish medication managment.    Diagnosis: F43.21 Adjustment Disorder with Depressed Mood                    F12.10 Cannabis use Disorder, Mild  Past Medical History:  Past Medical History:  Diagnosis Date  . Allergy   . Asthma   . Digestive disorder   . Esophagitis   . Gastritis   . Pneumonia     Past Surgical History:  Procedure Laterality Date  . ADENOIDECTOMY    . CIRCUMCISION      Family History:  Family History  Problem Relation Age of Onset  . Allergies Mother   . Cancer Maternal Grandfather   . Allergies Maternal Grandfather   . Heart disease Father   . Cancer Maternal Grandmother   . Cancer Paternal Grandmother     Social History:  reports that he has never smoked. He has never used smokeless tobacco. He reports current drug use. Frequency: 3.00 times per week. Drug: Marijuana. He reports that he does not drink alcohol.  Additional Social History:  Alcohol / Drug Use Pain Medications: See MARs Prescriptions: See MARs Over the Counter: See MARs History of alcohol / drug use?: Yes Longest period of sobriety (when/how long): 2 months from alcohol Substance #1 Name of Substance 1: Cannabis 1 - Age of First Use: unknown 1 - Amount (size/oz): "varies but up to 2 blunts" 1 - Frequency: "1  to 2 times per week" 1 - Duration: ongoing 1 - Last Use / Amount: PTA Substance #2 Name of Substance 2: Alcohol 2 - Age of First Use: unknown 2 - Amount (size/oz): unknown 2 - Frequency: socially 2 - Duration: ongoing 2 - Last Use / Amount: "2 months ago"  CIWA: CIWA-Ar BP: 137/70 Pulse Rate: (!) 124 COWS:    Allergies:  Allergies  Allergen Reactions  . Lactase Other (See Comments)    Upset stomach and bowel movements     Home Medications: (Not in a hospital admission)   OB/GYN Status:  No LMP for male patient.  General Assessment Data Location of  Assessment: [facility] ED TTS Assessment: In system Is this a Tele or Face-to-Face Assessment?: Tele Assessment Is this an Initial Assessment or a Re-assessment for this encounter?: Initial Assessment Patient Accompanied by:: N/A Language Other than English: No Living Arrangements: Other (Comment) What gender do you identify as?: Male Marital status: Single Living Arrangements: Parent, Other relatives(2 sisters) Can pt return to current living arrangement?: Yes Admission Status: Voluntary Is patient capable of signing voluntary admission?: Yes Referral Source: Self/Family/Friend     Crisis Care Plan Living Arrangements: Parent, Other relatives(2 sisters) Legal Guardian: Other:(Self) Name of Psychiatrist: Pt can't remember Name of Therapist: Charleston Ropes seen in Nov 2019)  Education Status Is patient currently in school?: No Is the patient employed, unemployed or receiving disability?: Employed  Risk to self with the past 6 months Suicidal Ideation: No Has patient been a risk to self within the past 6 months prior to admission? : No Suicidal Intent: No Has patient had any suicidal intent within the past 6 months prior to admission? : No Is patient at risk for suicide?: No Suicidal Plan?: No Has patient had any suicidal plan within the past 6 months prior to admission? : No Access to Means: No What has been your use of drugs/alcohol within the last 12 months?: Cannabis, Alcohol Previous Attempts/Gestures: Yes How many times?: 1 Triggers for Past Attempts: Other (Comment) Intentional Self Injurious Behavior: None Family Suicide History: Unknown Recent stressful life event(s): Other (Comment)(Current social issues) Persecutory voices/beliefs?: No Depression: Yes Depression Symptoms: Despondent, Fatigue, Loss of interest in usual pleasures, Feeling worthless/self pity Substance abuse history and/or treatment for substance abuse?: No Suicide prevention information given to  non-admitted patients: Not applicable  Risk to Others within the past 6 months Homicidal Ideation: No Does patient have any lifetime risk of violence toward others beyond the six months prior to admission? : No Thoughts of Harm to Others: No Current Homicidal Intent: No Current Homicidal Plan: No Access to Homicidal Means: No History of harm to others?: No Assessment of Violence: None Noted Does patient have access to weapons?: No Criminal Charges Pending?: No Does patient have a court date: No Is patient on probation?: No  Psychosis Hallucinations: None noted Delusions: None noted  Mental Status Report Appearance/Hygiene: Unremarkable Eye Contact: Fair Motor Activity: Freedom of movement, Unremarkable Speech: Logical/coherent Level of Consciousness: Alert, Quiet/awake Mood: Depressed Affect: Appropriate to circumstance Anxiety Level: None Thought Processes: Coherent, Relevant Judgement: Unimpaired Orientation: Person, Place, Time, Appropriate for developmental age Obsessive Compulsive Thoughts/Behaviors: None  Cognitive Functioning Concentration: Normal Memory: Recent Intact, Remote Intact Is patient IDD: No Insight: Fair Impulse Control: Fair Appetite: Poor Have you had any weight changes? : No Change Sleep: Increased(Pt states I'm napping a lot) Total Hours of Sleep: 8 Vegetative Symptoms: Staying in bed  ADLScreening [facility] Assessment Services) Patient's cognitive ability adequate to safely  complete daily activities?: Yes Patient able to express need for assistance with ADLs?: Yes Independently performs ADLs?: Yes (appropriate for developmental age)  Prior Inpatient Therapy Prior Inpatient Therapy: Yes Prior Therapy Dates: Spring 2019 Prior Therapy Facilty/Provider(s): [facility] Reason for Treatment: Depresion  Prior Outpatient Therapy Prior Outpatient Therapy: Yes Prior Therapy Dates: 2019-2020 Prior Therapy Facilty/Provider(s): Cleon Dew Reason for  Treatment: Depression Does patient have an ACCT team?: No Does patient have Intensive In-House Services?  : No Does patient have [facility] services? : No Does patient have P4CC services?: No  ADL Screening (condition at time of admission) Patient's cognitive ability adequate to safely complete daily activities?: Yes Is the patient deaf or have difficulty hearing?: No Does the patient have difficulty seeing, even when wearing glasses/contacts?: No Does the patient have difficulty concentrating, remembering, or making decisions?: No Patient able to express need for assistance with ADLs?: Yes Does the patient have difficulty dressing or bathing?: No Independently performs ADLs?: Yes (appropriate for developmental age) Does the patient have difficulty walking or climbing stairs?: No Weakness of Legs: None Weakness of Arms/Hands: None  Home Assistive Devices/Equipment Home Assistive Devices/Equipment: None    Abuse/Neglect Assessment (Assessment to be complete while patient is alone) Abuse/Neglect Assessment Can Be Completed: Yes Physical Abuse: Denies Verbal Abuse: Denies Sexual Abuse: Denies Exploitation of patient/patient's resources: Denies Self-Neglect: Denies Values / Beliefs Cultural Requests During Hospitalization: None Spiritual Requests During Hospitalization: None   Advance Directives (For Healthcare) Does Patient Have a Medical Advance Directive?: No Would patient like information on creating a medical advance directive?: No - Patient declined Nutrition Screen- [facility] Patient's home diet: NPO        Disposition: [facility] discussed case with BH provider, Malachy Chamber, NP who psychiatrically clears patient and recommends patient receives behavior health outpatient resources to reestablish medication managment.    Disposition Initial Assessment Completed for this Encounter: Yes  This service was provided via telemedicine using a 2-way, interactive audio and  video technology.  Names of all persons participating in this telemedicine service and their role in this encounter. Name: Paul Center Role: Patient  Name: Tyron Russell, MS, [facility], [facility] Role: Triage Specialist  Name: Malachy Chamber, NP Role: [facility] Provider  Name:  Role:     Tyron Russell, MS, [facility], [facility] 12/05/2018 8:53 AM

## 2018-12-28 DIAGNOSIS — K219 Gastro-esophageal reflux disease without esophagitis: Secondary | ICD-10-CM | POA: Diagnosis not present

## 2019-08-23 ENCOUNTER — Ambulatory Visit: Payer: Managed Care, Other (non HMO) | Attending: Internal Medicine

## 2019-08-23 DIAGNOSIS — Z20822 Contact with and (suspected) exposure to covid-19: Secondary | ICD-10-CM

## 2019-08-24 LAB — NOVEL CORONAVIRUS, NAA: SARS-CoV-2, NAA: NOT DETECTED

## 2020-07-05 ENCOUNTER — Ambulatory Visit (HOSPITAL_COMMUNITY)
Admission: AD | Admit: 2020-07-05 | Discharge: 2020-07-05 | Disposition: A | Discharge status: Home / Self Care | Payer: BLUE CROSS/BLUE SHIELD | Attending: Psychiatry | Admitting: Psychiatry
